# Patient Record
Sex: Female | Born: 1965 | Race: Black or African American | Hispanic: No | Smoking: Never smoker
Health system: Southern US, Community
[De-identification: ages and names within clinical notes are randomized; demographics above are authoritative.]

## PROBLEM LIST (undated history)

## (undated) DIAGNOSIS — R002 Palpitations: Secondary | ICD-10-CM

## (undated) DIAGNOSIS — E669 Obesity, unspecified: Secondary | ICD-10-CM

## (undated) DIAGNOSIS — F419 Anxiety disorder, unspecified: Secondary | ICD-10-CM

## (undated) DIAGNOSIS — K219 Gastro-esophageal reflux disease without esophagitis: Secondary | ICD-10-CM

## (undated) DIAGNOSIS — R7303 Prediabetes: Secondary | ICD-10-CM

## (undated) DIAGNOSIS — R600 Localized edema: Secondary | ICD-10-CM

## (undated) DIAGNOSIS — R0602 Shortness of breath: Secondary | ICD-10-CM

## (undated) DIAGNOSIS — D649 Anemia, unspecified: Secondary | ICD-10-CM

## (undated) DIAGNOSIS — G4730 Sleep apnea, unspecified: Secondary | ICD-10-CM

## (undated) DIAGNOSIS — K5900 Constipation, unspecified: Secondary | ICD-10-CM

## (undated) DIAGNOSIS — I10 Essential (primary) hypertension: Secondary | ICD-10-CM

## (undated) HISTORY — DX: Sleep apnea, unspecified: G47.30

## (undated) HISTORY — PX: TUBAL LIGATION: SHX77

## (undated) HISTORY — PX: WISDOM TOOTH EXTRACTION: SHX21

## (undated) HISTORY — DX: Shortness of breath: R06.02

## (undated) HISTORY — DX: Constipation, unspecified: K59.00

## (undated) HISTORY — DX: Localized edema: R60.0

## (undated) HISTORY — DX: Gastro-esophageal reflux disease without esophagitis: K21.9

## (undated) HISTORY — DX: Prediabetes: R73.03

## (undated) HISTORY — DX: Obesity, unspecified: E66.9

## (undated) HISTORY — DX: Anemia, unspecified: D64.9

## (undated) HISTORY — PX: DILATION AND CURETTAGE OF UTERUS: SHX78

## (undated) HISTORY — DX: Palpitations: R00.2

---

## 1999-05-10 ENCOUNTER — Emergency Department (HOSPITAL_COMMUNITY): Admission: EM | Admit: 1999-05-10 | Discharge: 1999-05-10 | Payer: Self-pay | Admitting: Emergency Medicine

## 1999-10-05 ENCOUNTER — Other Ambulatory Visit: Admission: RE | Admit: 1999-10-05 | Discharge: 1999-10-05 | Payer: Self-pay | Admitting: Gynecology

## 2002-03-18 ENCOUNTER — Other Ambulatory Visit: Admission: RE | Admit: 2002-03-18 | Discharge: 2002-03-18 | Payer: Self-pay | Admitting: Gynecology

## 2005-05-21 ENCOUNTER — Other Ambulatory Visit: Admission: RE | Admit: 2005-05-21 | Discharge: 2005-05-21 | Payer: Self-pay | Admitting: Gynecology

## 2005-07-23 ENCOUNTER — Emergency Department (HOSPITAL_COMMUNITY): Admission: EM | Admit: 2005-07-23 | Discharge: 2005-07-23 | Payer: Self-pay | Admitting: Emergency Medicine

## 2005-10-19 ENCOUNTER — Emergency Department (HOSPITAL_COMMUNITY): Admission: EM | Admit: 2005-10-19 | Discharge: 2005-10-20 | Payer: Self-pay | Admitting: *Deleted

## 2006-04-30 ENCOUNTER — Emergency Department (HOSPITAL_COMMUNITY): Admission: EM | Admit: 2006-04-30 | Discharge: 2006-04-30 | Payer: Self-pay | Admitting: Family Medicine

## 2006-07-08 ENCOUNTER — Emergency Department (HOSPITAL_COMMUNITY): Admission: EM | Admit: 2006-07-08 | Discharge: 2006-07-08 | Payer: Self-pay | Admitting: Emergency Medicine

## 2006-07-14 ENCOUNTER — Other Ambulatory Visit: Admission: RE | Admit: 2006-07-14 | Discharge: 2006-07-14 | Payer: Self-pay | Admitting: Gynecology

## 2013-08-23 ENCOUNTER — Encounter (HOSPITAL_COMMUNITY): Payer: Self-pay | Admitting: Pharmacist

## 2013-08-30 NOTE — H&P (Addendum)
47 yo presents for surgical mngt of endometrial polyp and irregular vb.  PMHx:  CHTN Meds:  Metoprolol All:  None PSHx:  c-section x 2 SHx:  Negative tobacco  AF, VSS Gen - NAD Abd - soft, NT CV - RRR Lungs - clear Ext - NT, no edema PV - uterus mobile nt, no adnexal masses  [place]:  Intramural and subserosal fibroids.  2cm intracavitary mass after saline infusion  A/P:  Irregular vb and endometrial polyp Hysteroscopy, D&C, resection of endometrial mass R/b/a discussed, questions answered, informed consent

## 2013-08-31 ENCOUNTER — Other Ambulatory Visit: Payer: Self-pay

## 2013-08-31 ENCOUNTER — Encounter (HOSPITAL_COMMUNITY)
Admission: RE | Admit: 2013-08-31 | Discharge: 2013-08-31 | Disposition: A | Discharge status: Home / Self Care | Payer: BC Managed Care – PPO | Source: Ambulatory Visit | Attending: Obstetrics and Gynecology | Admitting: Obstetrics and Gynecology

## 2013-08-31 ENCOUNTER — Encounter (HOSPITAL_COMMUNITY): Payer: Self-pay

## 2013-08-31 HISTORY — DX: Anxiety disorder, unspecified: F41.9

## 2013-08-31 HISTORY — DX: Essential (primary) hypertension: I10

## 2013-08-31 LAB — CBC
HCT: 37.6 % (ref 36.0–46.0)
Hemoglobin: 12.6 g/dL (ref 12.0–15.0)
MCH: 25.5 pg — ABNORMAL LOW (ref 26.0–34.0)
MCHC: 33.5 g/dL (ref 30.0–36.0)
MCV: 76.1 fL — ABNORMAL LOW (ref 78.0–100.0)
Platelets: 218 10*3/uL (ref 150–400)
RDW: 16 % — ABNORMAL HIGH (ref 11.5–15.5)
WBC: 5.9 10*3/uL (ref 4.0–10.5)

## 2013-08-31 LAB — BASIC METABOLIC PANEL
BUN: 11 mg/dL (ref 6–23)
CO2: 24 mEq/L (ref 19–32)
Calcium: 9.6 mg/dL (ref 8.4–10.5)
Chloride: 101 mEq/L (ref 96–112)
Creatinine, Ser: 0.71 mg/dL (ref 0.50–1.10)
GFR calc Af Amer: >90 mL/min (ref >90)
GFR calc non Af Amer: >90 mL/min (ref >90)
Glucose, Bld: 124 mg/dL — ABNORMAL HIGH (ref 70–99)
Potassium: 3.8 mEq/L (ref 3.7–5.3)

## 2013-08-31 MED ORDER — DEXTROSE 5 % IV SOLN
2.0000 g | INTRAVENOUS | Status: AC
  Administered 2013-09-01: 2 g via INTRAVENOUS
  Filled 2013-08-31: qty 2

## 2013-08-31 NOTE — Patient Instructions (Addendum)
   Your procedure is scheduled on: Wednesday, Dec 31  Enter through the [employer] [facility] at:  6 am Pick up the phone at the desk and dial [phone] and inform us of your arrival.  Please call this number if you have any problems the morning of surgery: [phone]  Remember: Do not eat or drink after midnight: Tuesday Take these medicines the morning of surgery with a SIP OF WATER:   Metoprolol/hctz Do not wear jewelry, make-up, or FINGER nail polish No metal in your hair or on your body. Do not wear lotions, powders, perfumes. You may wear deodorant.   Do not bring valuables to the hospital. Contacts, dentures or bridgework may not be worn into surgery.  Patients discharged on the day of surgery will not be allowed to drive home.  Home with husband Christy Middleton cell [phone].

## 2013-09-01 ENCOUNTER — Encounter (HOSPITAL_COMMUNITY)
Admission: RE | Disposition: A | Discharge status: Home / Self Care | Payer: Self-pay | Source: Ambulatory Visit | Attending: Obstetrics and Gynecology

## 2013-09-01 ENCOUNTER — Ambulatory Visit (HOSPITAL_COMMUNITY): Payer: BC Managed Care – PPO | Admitting: Anesthesiology

## 2013-09-01 ENCOUNTER — Encounter (HOSPITAL_COMMUNITY): Payer: BC Managed Care – PPO | Admitting: Anesthesiology

## 2013-09-01 ENCOUNTER — Ambulatory Visit (HOSPITAL_COMMUNITY)
Admission: RE | Admit: 2013-09-01 | Discharge: 2013-09-01 | Disposition: A | Discharge status: Home / Self Care | Payer: BC Managed Care – PPO | Source: Ambulatory Visit | Attending: Obstetrics and Gynecology | Admitting: Obstetrics and Gynecology

## 2013-09-01 ENCOUNTER — Encounter (HOSPITAL_COMMUNITY): Payer: Self-pay | Admitting: Anesthesiology

## 2013-09-01 DIAGNOSIS — N840 Polyp of corpus uteri: Secondary | ICD-10-CM | POA: Insufficient documentation

## 2013-09-01 DIAGNOSIS — D252 Subserosal leiomyoma of uterus: Secondary | ICD-10-CM | POA: Insufficient documentation

## 2013-09-01 DIAGNOSIS — D251 Intramural leiomyoma of uterus: Secondary | ICD-10-CM | POA: Insufficient documentation

## 2013-09-01 DIAGNOSIS — I10 Essential (primary) hypertension: Secondary | ICD-10-CM | POA: Insufficient documentation

## 2013-09-01 DIAGNOSIS — N938 Other specified abnormal uterine and vaginal bleeding: Secondary | ICD-10-CM | POA: Insufficient documentation

## 2013-09-01 DIAGNOSIS — N949 Unspecified condition associated with female genital organs and menstrual cycle: Secondary | ICD-10-CM | POA: Insufficient documentation

## 2013-09-01 HISTORY — PX: DILATATION & CURETTAGE/HYSTEROSCOPY WITH TRUECLEAR: SHX6353

## 2013-09-01 SURGERY — DILATATION & CURETTAGE/HYSTEROSCOPY WITH TRUCLEAR
Anesthesia: General | Site: Vagina

## 2013-09-01 MED ORDER — LIDOCAINE HCL 1 % IJ SOLN
INTRAMUSCULAR | Status: AC
  Filled 2013-09-01: qty 20

## 2013-09-01 MED ORDER — ONDANSETRON HCL 4 MG/2ML IJ SOLN
INTRAMUSCULAR | Status: DC | PRN
  Administered 2013-09-01: 4 mg via INTRAVENOUS

## 2013-09-01 MED ORDER — SODIUM CHLORIDE 0.9 % IR SOLN
Status: DC | PRN
  Administered 2013-09-01: 3000 mL

## 2013-09-01 MED ORDER — KETOROLAC TROMETHAMINE 30 MG/ML IJ SOLN
INTRAMUSCULAR | Status: AC
  Filled 2013-09-01: qty 1

## 2013-09-01 MED ORDER — FENTANYL CITRATE 0.05 MG/ML IJ SOLN
INTRAMUSCULAR | Status: DC | PRN
  Administered 2013-09-01: 50 ug via INTRAVENOUS
  Administered 2013-09-01 (×4): 25 ug via INTRAVENOUS

## 2013-09-01 MED ORDER — LIDOCAINE HCL 1 % IJ SOLN
INTRAMUSCULAR | Status: DC | PRN
  Administered 2013-09-01: 10 mL

## 2013-09-01 MED ORDER — LIDOCAINE HCL (CARDIAC) 20 MG/ML IV SOLN
INTRAVENOUS | Status: DC | PRN
  Administered 2013-09-01: 80 mg via INTRAVENOUS

## 2013-09-01 MED ORDER — MIDAZOLAM HCL 2 MG/2ML IJ SOLN
INTRAMUSCULAR | Status: DC | PRN
  Administered 2013-09-01: 1 mg via INTRAVENOUS

## 2013-09-01 MED ORDER — PROPOFOL 10 MG/ML IV EMUL
INTRAVENOUS | Status: AC
  Filled 2013-09-01: qty 40

## 2013-09-01 MED ORDER — DEXAMETHASONE SODIUM PHOSPHATE 4 MG/ML IJ SOLN
INTRAMUSCULAR | Status: DC | PRN
  Administered 2013-09-01: 5 mg via INTRAVENOUS

## 2013-09-01 MED ORDER — DEXAMETHASONE SODIUM PHOSPHATE 10 MG/ML IJ SOLN
INTRAMUSCULAR | Status: AC
  Filled 2013-09-01: qty 1

## 2013-09-01 MED ORDER — PROPOFOL 10 MG/ML IV BOLUS
INTRAVENOUS | Status: DC | PRN
  Administered 2013-09-01: 200 mg via INTRAVENOUS

## 2013-09-01 MED ORDER — LIDOCAINE HCL (CARDIAC) 20 MG/ML IV SOLN
INTRAVENOUS | Status: AC
  Filled 2013-09-01: qty 5

## 2013-09-01 MED ORDER — FENTANYL CITRATE 0.05 MG/ML IJ SOLN: 25.0000 ug | INTRAMUSCULAR | Status: DC | PRN

## 2013-09-01 MED ORDER — GLYCOPYRROLATE 0.2 MG/ML IJ SOLN
INTRAMUSCULAR | Status: DC | PRN
  Administered 2013-09-01: 0.1 mg via INTRAVENOUS

## 2013-09-01 MED ORDER — MEPERIDINE HCL 25 MG/ML IJ SOLN: 6.2500 mg | INTRAMUSCULAR | Status: DC | PRN

## 2013-09-01 MED ORDER — OXYCODONE-ACETAMINOPHEN 5-325 MG PO TABS: 2.0000 | ORAL_TABLET | ORAL | Status: DC | PRN

## 2013-09-01 MED ORDER — ONDANSETRON HCL 4 MG/2ML IJ SOLN
INTRAMUSCULAR | Status: AC
  Filled 2013-09-01: qty 2

## 2013-09-01 MED ORDER — MIDAZOLAM HCL 2 MG/2ML IJ SOLN
INTRAMUSCULAR | Status: AC
  Filled 2013-09-01: qty 2

## 2013-09-01 MED ORDER — METOCLOPRAMIDE HCL 5 MG/ML IJ SOLN
10.0000 mg | Freq: Once | INTRAMUSCULAR | Status: AC | PRN
  Administered 2013-09-01: 10 mg via INTRAVENOUS

## 2013-09-01 MED ORDER — FENTANYL CITRATE 0.05 MG/ML IJ SOLN
INTRAMUSCULAR | Status: AC
  Filled 2013-09-01: qty 5

## 2013-09-01 MED ORDER — LACTATED RINGERS IV SOLN
INTRAVENOUS | Status: DC
  Administered 2013-09-01 (×2): via INTRAVENOUS

## 2013-09-01 MED ORDER — KETOROLAC TROMETHAMINE 30 MG/ML IJ SOLN
INTRAMUSCULAR | Status: DC | PRN
  Administered 2013-09-01: 30 mg via INTRAVENOUS

## 2013-09-01 MED ORDER — METOCLOPRAMIDE HCL 5 MG/ML IJ SOLN
INTRAMUSCULAR | Status: AC
  Filled 2013-09-01: qty 2

## 2013-09-01 MED ORDER — GLYCOPYRROLATE 0.2 MG/ML IJ SOLN
INTRAMUSCULAR | Status: AC
  Filled 2013-09-01: qty 1

## 2013-09-01 SURGICAL SUPPLY — 22 items
BLADE INCISOR TRUC PLUS 2.9 (ABLATOR) IMPLANT
CANISTERS HI-FLOW 3000CC (CANNISTER) ×1 IMPLANT
CATH ROBINSON RED A/P 16FR (CATHETERS) ×2 IMPLANT
CLOTH BEACON ORANGE TIMEOUT ST (SAFETY) ×2 IMPLANT
CONTAINER PREFILL 10% NBF 60ML (FORM) ×4 IMPLANT
DRAPE HYSTEROSCOPY (DRAPE) ×2 IMPLANT
DRSG TELFA 3X8 NADH (GAUZE/BANDAGES/DRESSINGS) ×2 IMPLANT
GLOVE BIO SURGEON STRL SZ 6.5 (GLOVE) ×2 IMPLANT
GLOVE BIOGEL PI IND STRL 7.0 (GLOVE) ×1 IMPLANT
GLOVE BIOGEL PI INDICATOR 7.0 (GLOVE) ×1
GOWN STRL REIN XL XLG (GOWN DISPOSABLE) ×4 IMPLANT
INCISOR TRUC PLUS BLADE 2.9 (ABLATOR) ×2
KIT HYSTEROSCOPY TRUCLEAR (ABLATOR) ×1 IMPLANT
MORCELLATOR RECIP TRUCLEAR 4.0 (ABLATOR) IMPLANT
NDL SPNL 22GX3.5 QUINCKE BK (NEEDLE) ×1 IMPLANT
NEEDLE SPNL 22GX3.5 QUINCKE BK (NEEDLE) ×2 IMPLANT
PACK VAGINAL MINOR WOMEN LF (CUSTOM PROCEDURE TRAY) ×2 IMPLANT
PAD DRESSING TELFA 3X8 NADH (GAUZE/BANDAGES/DRESSINGS) ×1 IMPLANT
PAD OB MATERNITY 4.3X12.25 (PERSONAL CARE ITEMS) ×2 IMPLANT
SYR CONTROL 10ML LL (SYRINGE) ×2 IMPLANT
TOWEL OR 17X24 6PK STRL BLUE (TOWEL DISPOSABLE) ×4 IMPLANT
WATER STERILE IRR 1000ML POUR (IV SOLUTION) ×2 IMPLANT

## 2013-09-01 NOTE — Anesthesia Preprocedure Evaluation (Addendum)
Anesthesia Evaluation  Patient identified by MRN, date of birth, ID band Patient awake    Reviewed: Allergy & Precautions, H&P , NPO status , Patient's Chart, lab work & pertinent test results, reviewed documented beta blocker date and time   Airway Mallampati: III TM Distance: >3 FB Neck ROM: Full    Dental no notable dental hx. (+) Teeth Intact   Pulmonary neg pulmonary ROS,  breath sounds clear to auscultation  Pulmonary exam normal       Cardiovascular hypertension, Pt. on medications and Pt. on home beta blockers Rhythm:Regular Rate:Normal     Neuro/Psych Anxiety negative neurological ROS     GI/Hepatic negative GI ROS, Neg liver ROS,   Endo/Other  Morbid obesity  Renal/GU negative Renal ROS  negative genitourinary   Musculoskeletal negative musculoskeletal ROS (+)   Abdominal (+) + obese,   Peds  Hematology negative hematology ROS (+)   Anesthesia Other Findings   Reproductive/Obstetrics Endometrial Polyp Irregular menses                         Anesthesia Physical Anesthesia Plan  ASA: III  Anesthesia Plan: General   Post-op Pain Management:    Induction: Intravenous  Airway Management Planned: LMA  Additional Equipment:   Intra-op Plan:   Post-operative Plan: Extubation in OR  Informed Consent: I have reviewed the patients History and Physical, chart, labs and discussed the procedure including the risks, benefits and alternatives for the proposed anesthesia with the patient or authorized representative who has indicated his/her understanding and acceptance.   Dental advisory given  Plan Discussed with: Anesthesiologist, CRNA and Surgeon  Anesthesia Plan Comments:        Anesthesia Quick Evaluation

## 2013-09-01 NOTE — Anesthesia Postprocedure Evaluation (Signed)
Anesthesia Post Note  Patient: Christy Middleton  Procedure(s) Performed: Procedure(s) (LRB): DILATATION & CURETTAGE/HYSTEROSCOPY WITH TRUECLEAR (N/A)  Anesthesia type: GA  Patient location: PACU  Post pain: Pain level controlled  Post assessment: Post-op Vital signs reviewed  Last Vitals:  Filed Vitals:   09/01/13 0830  BP:   Pulse: 58  Temp:   Resp: 18    Post vital signs: Reviewed  Level of consciousness: sedated  Complications: No apparent anesthesia complications

## 2013-09-01 NOTE — Transfer of Care (Signed)
Immediate Anesthesia Transfer of Care Note  Patient: Christy Middleton  Procedure(s) Performed: Procedure(s): DILATATION & CURETTAGE/HYSTEROSCOPY WITH TRUECLEAR (N/A)  Patient Location: PACU  Anesthesia Type:General  Level of Consciousness: awake, alert , oriented and patient cooperative  Airway & Oxygen Therapy: Patient Spontanous Breathing and Patient connected to nasal cannula oxygen  Post-op Assessment: Report given to PACU RN and Post -op Vital signs reviewed and stable  Post vital signs: stable  Complications: No apparent anesthesia complications

## 2013-09-02 ENCOUNTER — Encounter (HOSPITAL_COMMUNITY): Payer: Self-pay | Admitting: Obstetrics and Gynecology

## 2013-09-02 NOTE — Op Note (Signed)
[name], Christy Middleton                ACCOUNT NO.:  [id number]  MEDICAL RECORD NO.:  [id number]  LOCATION:  [facility]                          FACILITY:  [facility]  PHYSICIAN:  Marylynn Pearson, MD    DATE OF BIRTH:  [birth year]  DATE OF PROCEDURE:  09/01/2013 DATE OF DISCHARGE:  09/01/2013                              OPERATIVE REPORT   PREOPERATIVE DIAGNOSIS:  Endometrial mass.  POSTOPERATIVE DIAGNOSIS:  Endometrial mass, path pending.  PROCEDURE: 1. Cervical block. 2. Hysteroscopy. 3. D and C. 4. Resection of endometrial polyp using Truclear.  ANESTHESIA:  General.  SPECIMEN:  Endometrial curettings and polypoid mass.  COMPLICATIONS:  None.  CONDITION:  Stable to recovery room.  DESCRIPTION OF PROCEDURE:  The patient was taken to the operating room. After informed consent was obtained, she was given general anesthesia, placed in the dorsal lithotomy position using Allen stirrups.  She was prepped and draped in sterile fashion.  An in- and out catheter was used to drain her bladder.  Bivalve speculum was placed in the vagina and a single-tooth tenaculum attached to the anterior lip of the cervix.  The cervix was serially dilated with Kennon Rounds dilators and a small scope was inserted into the endometrial cavity.  Endometrial cavity appeared normal, without masses.  Polypoid mass was noted at the internal os. The Truclear device was inserted through the scope and used to resect the polyp to the base.  Hysteroscope was then removed and a gentle curetting was performed throughout the endometrial cavity and placed on Telfa, passed off to be sent to pathology.  Hysteroscope was reinserted one last time.  No other abnormalities or masses were seen. Hysteroscope was removed.  Cervical block was performed.  Tenaculum was removed.  A 1 mL of Xylocaine was placed at the anterior lip of the cervix.  Speculum was removed.  She was taken to the recovery room in stable condition.  Sponge, lap, needle,  and instrument counts were correct x2.     Marylynn Pearson, MD     GA/MEDQ  D:  09/01/2013  T:  09/02/2013  Job:  [id number]

## 2014-11-30 ENCOUNTER — Other Ambulatory Visit: Payer: Self-pay | Admitting: Gynecology

## 2014-12-02 LAB — CYTOLOGY - PAP

## 2015-08-25 ENCOUNTER — Encounter: Payer: Self-pay | Admitting: Family Medicine

## 2015-08-31 ENCOUNTER — Institutional Professional Consult (permissible substitution): Payer: Self-pay | Admitting: Neurology

## 2016-04-04 ENCOUNTER — Ambulatory Visit: Payer: BC Managed Care – PPO | Admitting: Gynecology

## 2016-04-16 ENCOUNTER — Encounter: Payer: Self-pay | Admitting: Gynecology

## 2016-04-16 ENCOUNTER — Ambulatory Visit (INDEPENDENT_AMBULATORY_CARE_PROVIDER_SITE_OTHER): Payer: BC Managed Care – PPO | Admitting: Gynecology

## 2016-04-16 VITALS — BP 160/98 | Ht 64.0 in | Wt 224.0 lb

## 2016-04-16 DIAGNOSIS — Z01419 Encounter for gynecological examination (general) (routine) without abnormal findings: Secondary | ICD-10-CM

## 2016-04-16 NOTE — Progress Notes (Signed)
Christy Middleton Apr 16, 1966 [id number]   History:    50 y.o.  for annual gyn exam who is a new patient to the practice. Patient prior patient of Dr. Lowella Dell who has retired. Patient stated she had a full gynecological examination year ago with Pap smear. Patient denies any prior history of any abnormal Pap smear. She is currently menstruating once a month still. She has no vasomotor symptoms as had a tubal ligation the past. Several years ago he cousin dysfunctional uterine bleeding she had a resectoscopic polypectomy. Patient has not had a colonoscopy as of yet. Her PCP is Dr. Nena Polio who is treating and monitoring her hypertension  Past medical history,surgical history, family history and social history were all reviewed and documented in the EPIC chart.  Gynecologic History Patient's last menstrual period was 03/29/2016. Contraception: tubal ligation Last Pap: 2016. Results were: normal Last mammogram: 2016. Results were: normal  Obstetric History OB History  Gravida Para Term Preterm AB Living  2       0 2  SAB TAB Ectopic Multiple Live Births      0        # Outcome Date GA Lbr Len/2nd Weight Sex Delivery Anes PTL Lv  2 Gravida           1 Gravida                ROS: A ROS was performed and pertinent positives and negatives are included in the history.  GENERAL: No fevers or chills. HEENT: No change in vision, no earache, sore throat or sinus congestion. NECK: No pain or stiffness. CARDIOVASCULAR: No chest pain or pressure. No palpitations. PULMONARY: No shortness of breath, cough or wheeze. GASTROINTESTINAL: No abdominal pain, nausea, vomiting or diarrhea, melena or bright red blood per rectum. GENITOURINARY: No urinary frequency, urgency, hesitancy or dysuria. MUSCULOSKELETAL: No joint or muscle pain, no back pain, no recent trauma. DERMATOLOGIC: No rash, no itching, no lesions. ENDOCRINE: No polyuria, polydipsia, no heat or cold intolerance. No recent change in weight.  HEMATOLOGICAL: No anemia or easy bruising or bleeding. NEUROLOGIC: No headache, seizures, numbness, tingling or weakness. PSYCHIATRIC: No depression, no loss of interest in normal activity or change in sleep pattern.     Exam: chaperone present  BP (!) 160/98   Ht 5\' 4"  (1.626 m)   Wt 224 lb (101.6 kg)   LMP 03/29/2016   BMI 38.45 kg/m   Body mass index is 38.45 kg/m.  General appearance : Well developed well nourished female. No acute distress HEENT: Eyes: no retinal hemorrhage or exudates,  Neck supple, trachea midline, no carotid bruits, no thyroidmegaly Lungs: Clear to auscultation, no rhonchi or wheezes, or rib retractions  Heart: Regular rate and rhythm, no murmurs or gallops Breast:Examined in sitting and supine position were symmetrical in appearance, no palpable masses or tenderness,  no skin retraction, no nipple inversion, no nipple discharge, no skin discoloration, no axillary or supraclavicular lymphadenopathy Abdomen: no palpable masses or tenderness, no rebound or guarding Extremities: no edema or skin discoloration or tenderness  Pelvic:  Bartholin, Urethra, Skene Glands: Within normal limits             Vagina: No gross lesions or discharge  Cervix: No gross lesions or discharge  Uterus  anteverted, normal size, shape and consistency, non-tender and mobile  Adnexa  Without masses or tenderness  Anus and perineum  normal   Rectovaginal  normal sphincter tone without palpated masses or tenderness  Hemoccult not indicated   Blood pressure: 160/98 patient was asked to rest for 15 minutes and her blood pressure was retaken 130/80 mmHg.   Assessment/Plan:  50 y.o. female for annual exam with history of hypertension is scheduled for her annual exam next month with her PCP AB encourage her to contact her office this week to be seen to adjust her blood pressure medication. She was otherwise asymptomatic today. Pap smear not done since she had a normal one last  year she's had history of normal Pap smears according to the new guidelines. Her PCP has been doing her blood work. I have recommended that she have colonoscopy this year and I provided her information on colonoscopy the names of community gastroenterologist for her to schedule. She was encouraged to do her monthly breast exam. We discussed importance of calcium vitamin D and weightbearing exercises for osteoporosis prevention.   Terrance Mass MD, 10:03 AM 04/16/2016

## 2016-04-16 NOTE — Patient Instructions (Signed)
Perimenopause Perimenopause is the time when your body begins to move into the menopause (no menstrual period for 12 straight months). It is a natural process. Perimenopause can begin 2-8 years before the menopause and usually lasts for 1 year after the menopause. During this time, your ovaries may or may not produce an egg. The ovaries vary in their production of estrogen and progesterone hormones each month. This can cause irregular menstrual periods, difficulty getting pregnant, vaginal bleeding between periods, and uncomfortable symptoms. CAUSES  Irregular production of the ovarian hormones, estrogen and progesterone, and not ovulating every month.  Other causes include:  Tumor of the pituitary gland in the brain.  Medical disease that affects the ovaries.  Radiation treatment.  Chemotherapy.  Unknown causes.  Heavy smoking and excessive alcohol intake can bring on perimenopause sooner. SIGNS AND SYMPTOMS   Hot flashes.  Night sweats.  Irregular menstrual periods.  Decreased sex drive.  Vaginal dryness.  Headaches.  Mood swings.  Depression.  Memory problems.  Irritability.  Tiredness.  Weight gain.  Trouble getting pregnant.  The beginning of losing bone cells (osteoporosis).  The beginning of hardening of the arteries (atherosclerosis). DIAGNOSIS  Your health care provider will make a diagnosis by analyzing your age, menstrual history, and symptoms. He or she will do a physical exam and note any changes in your body, especially your female organs. Female hormone tests may or may not be helpful depending on the amount of female hormones you produce and when you produce them. However, other hormone tests may be helpful to rule out other problems. TREATMENT  In some cases, no treatment is needed. The decision on whether treatment is necessary during the perimenopause should be made by you and your health care provider based on how the symptoms are affecting you  and your lifestyle. Various treatments are available, such as:  Treating individual symptoms with a specific medicine for that symptom.  Herbal medicines that can help specific symptoms.  Counseling.  Group therapy. HOME CARE INSTRUCTIONS   Keep track of your menstrual periods (when they occur, how heavy they are, how long between periods, and how long they last) as well as your symptoms and when they started.  Only take over-the-counter or prescription medicines as directed by your health care provider.  Sleep and rest.  Exercise.  Eat a diet that contains calcium (good for your bones) and soy (acts like the estrogen hormone).  Do not smoke.  Avoid alcoholic beverages.  Take vitamin supplements as recommended by your health care provider. Taking vitamin E may help in certain cases.  Take calcium and vitamin D supplements to help prevent bone loss.  Group therapy is sometimes helpful.  Acupuncture may help in some cases. SEEK MEDICAL CARE IF:   You have questions about any symptoms you are having.  You need a referral to a specialist (gynecologist, psychiatrist, or psychologist). SEEK IMMEDIATE MEDICAL CARE IF:   You have vaginal bleeding.  Your period lasts longer than 8 days.  Your periods are recurring sooner than 21 days.  You have bleeding after intercourse.  You have severe depression.  You have pain when you urinate.  You have severe headaches.  You have vision problems.   This information is not intended to replace advice given to you by your health care provider. Make sure you discuss any questions you have with your health care provider.   Document Released: 09/26/2004 Document Revised: 09/09/2014 Document Reviewed: 03/18/2013 Elsevier Interactive Patient Education [employer].  Colonoscopy A colonoscopy is an exam to look at the entire large intestine (colon). This exam can help find problems such as tumors, polyps, inflammation, and areas  of bleeding. The exam takes about 1 hour.  LET [facility] CARE PROVIDER KNOW ABOUT:   Any allergies you have.  All medicines you are taking, including vitamins, herbs, eye drops, creams, and over-the-counter medicines.  Previous problems you or members of your family have had with the use of anesthetics.  Any blood disorders you have.  Previous surgeries you have had.  Medical conditions you have. RISKS AND COMPLICATIONS  Generally, this is a safe procedure. However, as with any procedure, complications can occur. Possible complications include:  Bleeding.  Tearing or rupture of the colon wall.  Reaction to medicines given during the exam.  Infection (rare). BEFORE THE PROCEDURE   Ask your health care provider about changing or stopping your regular medicines.  You may be prescribed an oral bowel prep. This involves drinking a large amount of medicated liquid, starting the day before your procedure. The liquid will cause you to have multiple loose stools until your stool is almost clear or light green. This cleans out your colon in preparation for the procedure.  Do not eat or drink anything else once you have started the bowel prep, unless your health care provider tells you it is safe to do so.  Arrange for someone to drive you home after the procedure. PROCEDURE   You will be given medicine to help you relax (sedative).  You will lie on your side with your knees bent.  A long, flexible tube with a light and camera on the end (colonoscope) will be inserted through the rectum and into the colon. The camera sends video back to a computer screen as it moves through the colon. The colonoscope also releases carbon dioxide gas to inflate the colon. This helps your health care provider see the area better.  During the exam, your health care provider may take a small tissue sample (biopsy) to be examined under a microscope if any abnormalities are found.  The exam is finished  when the entire colon has been viewed. AFTER THE PROCEDURE   Do not drive for 24 hours after the exam.  You may have a small amount of blood in your stool.  You may pass moderate amounts of gas and have mild abdominal cramping or bloating. This is caused by the gas used to inflate your colon during the exam.  Ask when your test results will be ready and how you will get your results. Make sure you get your test results.   This information is not intended to replace advice given to you by your health care provider. Make sure you discuss any questions you have with your health care provider.   Document Released: 08/16/2000 Document Revised: 06/09/2013 Document Reviewed: 04/26/2013 Elsevier Interactive Patient Education [employer].

## 2017-01-15 ENCOUNTER — Encounter: Payer: Self-pay | Admitting: Gynecology

## 2017-04-17 ENCOUNTER — Encounter: Payer: BC Managed Care – PPO | Admitting: Gynecology

## 2017-08-17 ENCOUNTER — Encounter (HOSPITAL_BASED_OUTPATIENT_CLINIC_OR_DEPARTMENT_OTHER): Payer: Self-pay | Admitting: *Deleted

## 2017-08-17 ENCOUNTER — Emergency Department (HOSPITAL_BASED_OUTPATIENT_CLINIC_OR_DEPARTMENT_OTHER): Payer: Self-pay

## 2017-08-17 ENCOUNTER — Other Ambulatory Visit: Payer: Self-pay

## 2017-08-17 ENCOUNTER — Emergency Department (HOSPITAL_BASED_OUTPATIENT_CLINIC_OR_DEPARTMENT_OTHER)
Admission: EM | Admit: 2017-08-17 | Discharge: 2017-08-17 | Disposition: A | Discharge status: Home / Self Care | Payer: Self-pay | Attending: Physician Assistant | Admitting: Physician Assistant

## 2017-08-17 DIAGNOSIS — I10 Essential (primary) hypertension: Secondary | ICD-10-CM | POA: Insufficient documentation

## 2017-08-17 DIAGNOSIS — Z79899 Other long term (current) drug therapy: Secondary | ICD-10-CM | POA: Insufficient documentation

## 2017-08-17 DIAGNOSIS — R0789 Other chest pain: Secondary | ICD-10-CM | POA: Insufficient documentation

## 2017-08-17 DIAGNOSIS — F419 Anxiety disorder, unspecified: Secondary | ICD-10-CM | POA: Insufficient documentation

## 2017-08-17 LAB — CBC WITH DIFFERENTIAL/PLATELET
BASOS ABS: 0 10*3/uL (ref 0.0–0.1)
Basophils Relative: 0 %
Eosinophils Absolute: 0.1 10*3/uL (ref 0.0–0.7)
Eosinophils Relative: 3 %
HEMATOCRIT: 36.6 % (ref 36.0–46.0)
HEMOGLOBIN: 12.2 g/dL (ref 12.0–15.0)
LYMPHS PCT: 36 %
Lymphs Abs: 1.7 10*3/uL (ref 0.7–4.0)
MCH: 25.7 pg — ABNORMAL LOW (ref 26.0–34.0)
MCHC: 33.3 g/dL (ref 30.0–36.0)
MCV: 77.2 fL — AB (ref 78.0–100.0)
Monocytes Absolute: 0.4 10*3/uL (ref 0.1–1.0)
Monocytes Relative: 8 %
NEUTROS ABS: 2.6 10*3/uL (ref 1.7–7.7)
Neutrophils Relative %: 53 %
Platelets: 234 10*3/uL (ref 150–400)
RBC: 4.74 MIL/uL (ref 3.87–5.11)
RDW: 16 % — ABNORMAL HIGH (ref 11.5–15.5)
WBC: 4.9 10*3/uL (ref 4.0–10.5)

## 2017-08-17 LAB — BASIC METABOLIC PANEL
ANION GAP: 8 (ref 5–15)
BUN: 11 mg/dL (ref 6–20)
CHLORIDE: 106 mmol/L (ref 101–111)
CO2: 25 mmol/L (ref 22–32)
Calcium: 9.3 mg/dL (ref 8.9–10.3)
Creatinine, Ser: 0.85 mg/dL (ref 0.44–1.00)
GFR calc Af Amer: >60 mL/min (ref >60)
GFR calc non Af Amer: >60 mL/min (ref >60)
GLUCOSE: 130 mg/dL — AB (ref 65–99)
POTASSIUM: 3.4 mmol/L — AB (ref 3.5–5.1)
Sodium: 139 mmol/L (ref 135–145)

## 2017-08-17 LAB — TROPONIN I: Troponin I: <0.03 ng/mL (ref <0.03)

## 2017-08-17 MED ORDER — GI COCKTAIL ~~LOC~~
30.0000 mL | Freq: Once | ORAL | Status: AC
  Administered 2017-08-17: 30 mL via ORAL
  Filled 2017-08-17: qty 30

## 2017-08-17 MED ORDER — AMLODIPINE BESYLATE 5 MG PO TABS
5.0000 mg | ORAL_TABLET | Freq: Once | ORAL | Status: AC
  Administered 2017-08-17: 5 mg via ORAL
  Filled 2017-08-17: qty 1

## 2017-08-17 MED ORDER — OMEPRAZOLE 20 MG PO CPDR: 20.0000 mg | DELAYED_RELEASE_CAPSULE | Freq: Every day | ORAL | 0 refills | Status: DC

## 2017-08-17 NOTE — Discharge Instructions (Signed)
Christy Middleton, you were seen today for chest pain.  Fortunately you had a normal EKG and troponins and we did not think this was your heart.  Most likely this was due to acid reflux.  I have sent you home with a prescription for medication called omeprazole.  Please take this medication daily for the next month.  I would recommend that you follow-up with your primary doctor in 2 weeks to see if this has resolved your symptoms.
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
## 2017-08-17 NOTE — ED Triage Notes (Signed)
Pt reports chest pain 1 hour pta. Pain started after eating. States pain in abdomen and chest. She was sweaty when pain started. States sx are improving

## 2017-08-17 NOTE — ED Notes (Signed)
Patient transported to X-ray 

## 2017-08-17 NOTE — ED Notes (Signed)
ED Provider at bedside. 

## 2019-03-11 ENCOUNTER — Other Ambulatory Visit (HOSPITAL_COMMUNITY)
Admission: RE | Admit: 2019-03-11 | Discharge: 2019-03-11 | Disposition: A | Discharge status: Home / Self Care | Payer: BC Managed Care – PPO | Source: Ambulatory Visit | Attending: Obstetrics and Gynecology | Admitting: Obstetrics and Gynecology

## 2019-03-11 ENCOUNTER — Other Ambulatory Visit: Payer: Self-pay | Admitting: Obstetrics and Gynecology

## 2019-03-11 DIAGNOSIS — Z01419 Encounter for gynecological examination (general) (routine) without abnormal findings: Secondary | ICD-10-CM | POA: Insufficient documentation

## 2019-03-16 LAB — CYTOLOGY - PAP
Adequacy: ABSENT
Diagnosis: NEGATIVE
HPV: NOT DETECTED

## 2019-09-10 ENCOUNTER — Ambulatory Visit (HOSPITAL_COMMUNITY)
Admission: EM | Admit: 2019-09-10 | Discharge: 2019-09-10 | Disposition: A | Discharge status: Home / Self Care | Payer: BC Managed Care – PPO | Attending: Family Medicine | Admitting: Family Medicine

## 2019-09-10 ENCOUNTER — Encounter (HOSPITAL_COMMUNITY): Payer: Self-pay | Admitting: Family Medicine

## 2019-09-10 ENCOUNTER — Other Ambulatory Visit: Payer: Self-pay

## 2019-09-10 DIAGNOSIS — M545 Low back pain, unspecified: Secondary | ICD-10-CM

## 2019-09-10 MED ORDER — HYDROCODONE-ACETAMINOPHEN 5-325 MG PO TABS: 1.0000 | ORAL_TABLET | Freq: Four times a day (QID) | ORAL | 0 refills | Status: DC | PRN
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
## 2019-10-09 IMAGING — CR DG CHEST 2V
2 series · 2 of 2 positions shown · non-contrast
Comparison: 07/08/2006

CLINICAL DATA: Acute onset chest discomfort and lightheadedness x4
hours.

EXAM:
CHEST  2 VIEW

[w chest pa]
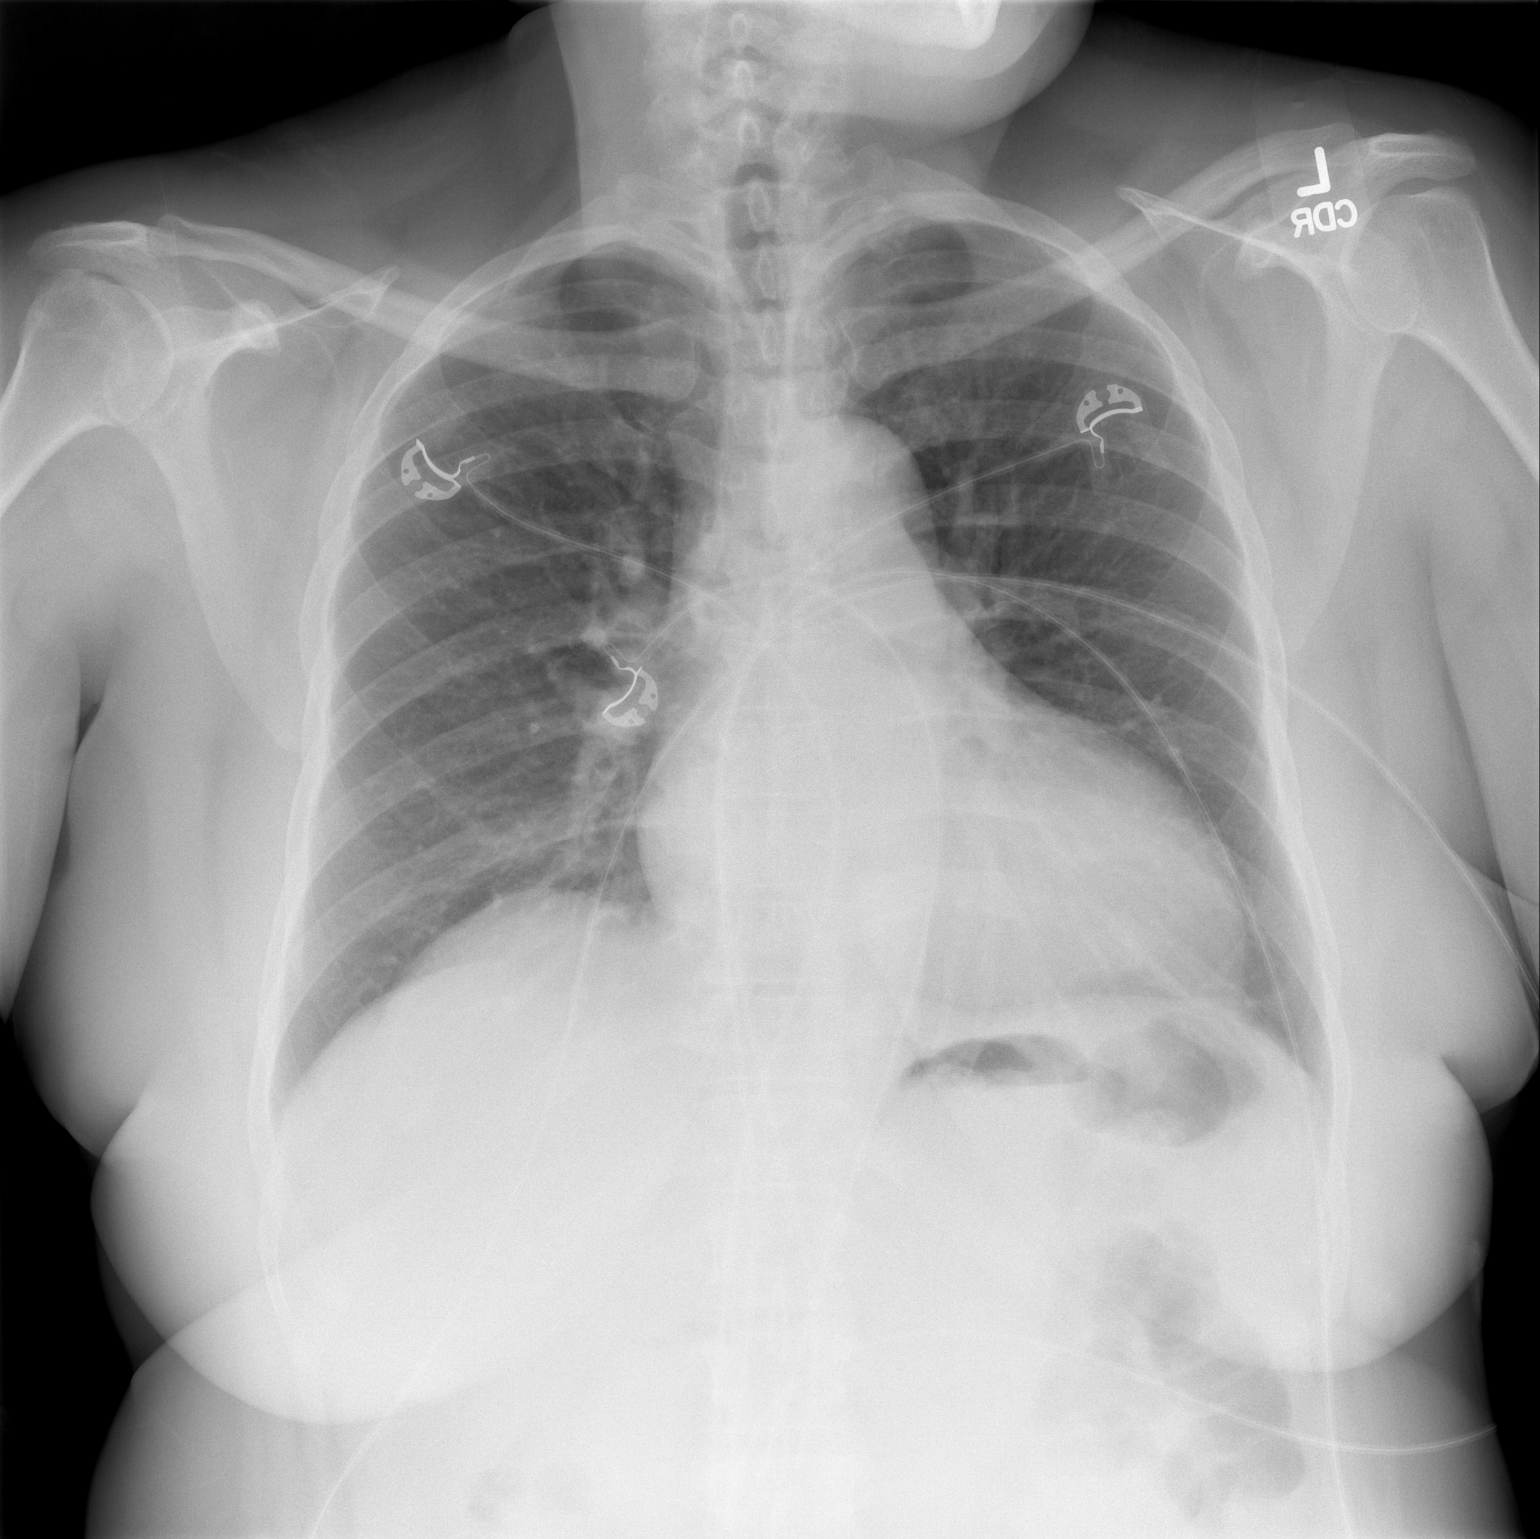

[w chest lat]
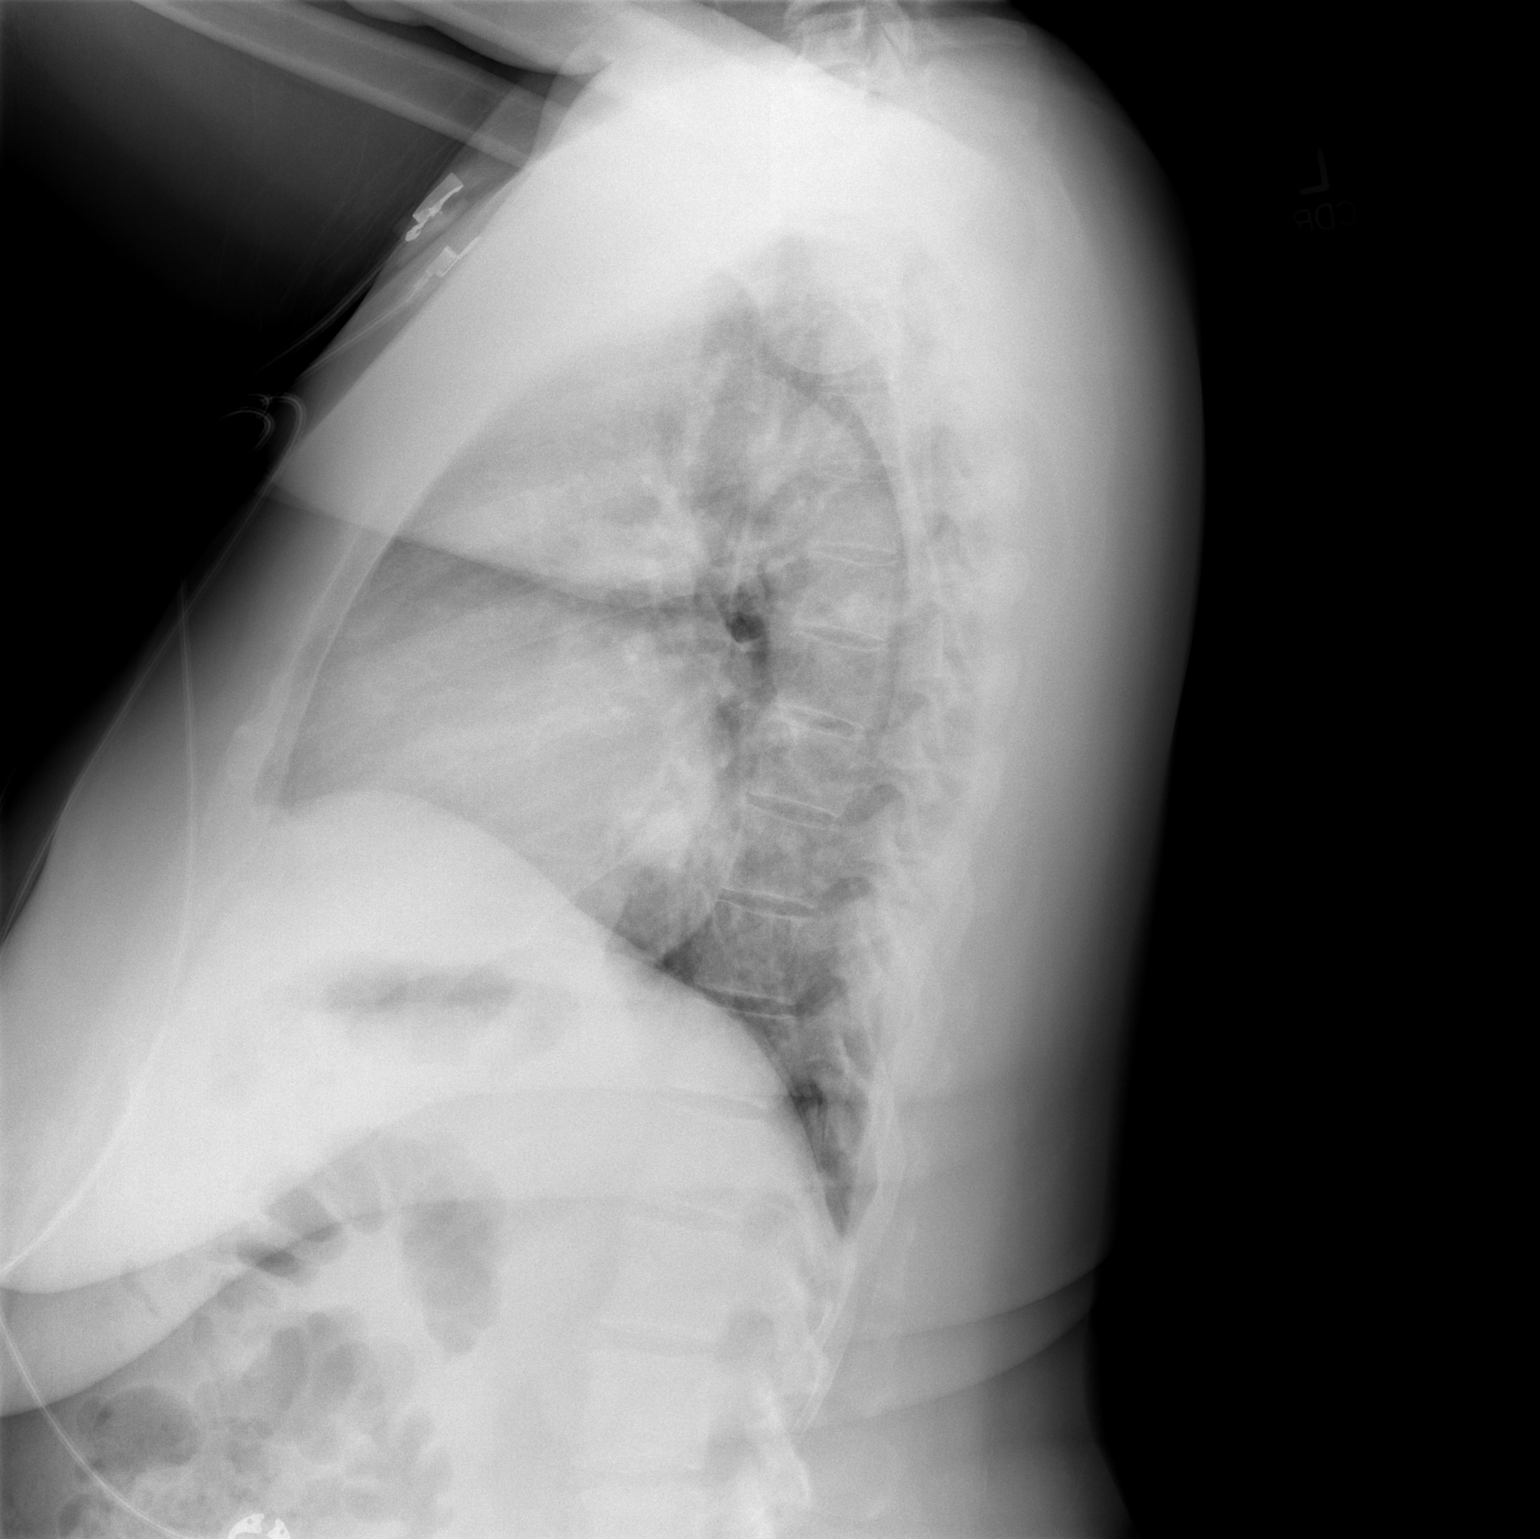

[2 of 2 positions shown; findings below may reference images not displayed]

FINDINGS: Stable mild cardiomegaly. No aortic aneurysm. Minimal atelectasis at
the right lung base. No acute pneumonic consolidation, CHF, effusion
or pneumothorax. No suspicious osseous lesions.
IMPRESSION: Stable mild cardiomegaly.  No active pulmonary disease.

## 2019-10-30 ENCOUNTER — Ambulatory Visit: Payer: BC Managed Care – PPO | Attending: Internal Medicine

## 2019-10-30 DIAGNOSIS — Z23 Encounter for immunization: Secondary | ICD-10-CM | POA: Insufficient documentation

## 2019-10-30 NOTE — Progress Notes (Signed)
   Covid-19 Vaccination Clinic  Name:  Noralyn Anderberg    MRN: [id number] DOB: [birth year]  10/30/2019  Ms. Sterman was observed post Covid-19 immunization for 15 minutes without incidence. She was provided with Vaccine Information Sheet and instruction to access the V-Safe system.   Ms. Mewes was instructed to call 911 with any severe reactions post vaccine: Marland Kitchen Difficulty breathing  . Swelling of your face and throat  . A fast heartbeat  . A bad rash all over your body  . Dizziness and weakness    Immunizations Administered    Name Date Dose VIS Date Route   Pfizer COVID-19 Vaccine 10/30/2019 12:09 PM 0.3 mL 08/13/2019 Intramuscular   Manufacturer: [place]   Lot: [id number]   [place]: [id number]

## 2019-11-20 ENCOUNTER — Ambulatory Visit: Payer: BC Managed Care – PPO | Attending: Internal Medicine

## 2019-11-20 DIAGNOSIS — Z23 Encounter for immunization: Secondary | ICD-10-CM

## 2019-11-20 NOTE — Progress Notes (Signed)
   Covid-19 Vaccination Clinic  Name:  Christy Middleton    MRN: [id number] DOB: 1966-03-09  11/20/2019  [name] was observed post Covid-19 immunization for 15 minutes without incident. She was provided with Vaccine Information Sheet and instruction to access the V-Safe system.   [name] was instructed to call 911 with any severe reactions post vaccine: Marland Kitchen Difficulty breathing  . Swelling of face and throat  . A fast heartbeat  . A bad rash all over body  . Dizziness and weakness   Immunizations Administered    Name Date Dose VIS Date Route   Pfizer COVID-19 Vaccine 11/20/2019 11:20 AM 0.3 mL 08/13/2019 Intramuscular   Manufacturer: [place]   Lot: [id number]   [place]: [id number]

## 2019-11-24 ENCOUNTER — Ambulatory Visit: Payer: BC Managed Care – PPO

## 2020-11-06 ENCOUNTER — Ambulatory Visit: Payer: BC Managed Care – PPO | Admitting: Podiatry

## 2021-07-27 ENCOUNTER — Telehealth: Payer: BC Managed Care – PPO | Admitting: Emergency Medicine

## 2021-07-27 DIAGNOSIS — K047 Periapical abscess without sinus: Secondary | ICD-10-CM

## 2021-07-27 MED ORDER — AMOXICILLIN 500 MG PO CAPS: 500.0000 mg | ORAL_CAPSULE | Freq: Three times a day (TID) | ORAL | 0 refills | Status: AC

## 2021-07-27 MED ORDER — NAPROXEN 500 MG PO TABS: 500.0000 mg | ORAL_TABLET | Freq: Two times a day (BID) | ORAL | 0 refills | Status: DC

## 2021-07-27 NOTE — Progress Notes (Signed)

## 2021-07-27 NOTE — Progress Notes (Signed)
Error. Duplicate evisit sent in by pt.

## 2022-04-11 ENCOUNTER — Other Ambulatory Visit: Payer: Self-pay | Admitting: Obstetrics and Gynecology

## 2022-04-11 DIAGNOSIS — Z1231 Encounter for screening mammogram for malignant neoplasm of breast: Secondary | ICD-10-CM

## 2022-05-07 ENCOUNTER — Ambulatory Visit: Payer: BC Managed Care – PPO

## 2022-06-25 ENCOUNTER — Other Ambulatory Visit: Payer: Self-pay

## 2022-11-27 ENCOUNTER — Ambulatory Visit (HOSPITAL_BASED_OUTPATIENT_CLINIC_OR_DEPARTMENT_OTHER): Admit: 2022-11-27 | Payer: BC Managed Care – PPO | Admitting: Obstetrics and Gynecology

## 2022-11-27 ENCOUNTER — Encounter (HOSPITAL_BASED_OUTPATIENT_CLINIC_OR_DEPARTMENT_OTHER): Payer: Self-pay

## 2022-11-27 SURGERY — DILATATION & CURETTAGE/HYSTEROSCOPY WITH MYOSURE
Anesthesia: Choice

## 2022-12-30 ENCOUNTER — Encounter (INDEPENDENT_AMBULATORY_CARE_PROVIDER_SITE_OTHER): Payer: Self-pay | Admitting: Family Medicine

## 2022-12-30 ENCOUNTER — Ambulatory Visit (INDEPENDENT_AMBULATORY_CARE_PROVIDER_SITE_OTHER): Payer: BC Managed Care – PPO | Admitting: Family Medicine

## 2022-12-30 VITALS — BP 151/86 | HR 78 | Temp 98.0°F | Ht 64.0 in | Wt 243.4 lb

## 2022-12-30 DIAGNOSIS — Z0289 Encounter for other administrative examinations: Secondary | ICD-10-CM

## 2022-12-30 DIAGNOSIS — I10 Essential (primary) hypertension: Secondary | ICD-10-CM | POA: Insufficient documentation

## 2022-12-30 DIAGNOSIS — R7303 Prediabetes: Secondary | ICD-10-CM

## 2022-12-30 DIAGNOSIS — Z6841 Body Mass Index (BMI) 40.0 and over, adult: Secondary | ICD-10-CM | POA: Diagnosis not present

## 2023-01-01 NOTE — Progress Notes (Signed)
Office: [phone]  /  Fax: [phone]   Initial Visit  Christy Middleton was seen in clinic today to evaluate for obesity. She is interested in losing weight to improve overall health and reduce the risk of weight related complications. She presents today to review program treatment options, initial physical assessment, and evaluation.     She was referred by: PCP  When asked how has your weight affected you? She states: Contributed to medical problems  Some associated conditions: Hypertension and Prediabetes  Response to medication: Never tried medications   Past medical history includes:   Past Medical History:  Diagnosis Date   Anxiety    no meds currently   Hypertension      Objective:   BP (!) 151/86   Pulse 78   Temp 98 F (36.7 C)   Ht 5\' 4"  (1.626 m)   Wt 243 lb 6.4 oz (110.4 kg)   SpO2 95%   BMI 41.78 kg/m  She was weighed on the bioimpedance scale: Body mass index is 41.78 kg/m. Visceral Fat Rating:14, Body Fat%:44.5.  General:  Alert, oriented and cooperative. Patient is in no acute distress.  Respiratory: Normal respiratory effort, no problems with respiration noted  Extremities: Normal range of motion.    Mental Status: Normal mood and affect. Normal behavior. Normal judgment and thought content.   Assessment and Plan:  1. Prediabetes Christy Middleton has had multiple elevated A1c at 5.9.  She is not on metformin, and she is ready to work on her diet and exercise.  Christy Middleton is to schedule a fasting visit to start her evaluation and eating plan.  2. Primary hypertension Christy Middleton's blood pressure is elevated today even after repeat blood pressure check.  She is on blood pressure medications and she had lower extremity edema on Norvasc in the past.  Christy Middleton will continue olmesartan-hydrochlorothiazide, and will start her eating plan after her workup.  3. Class 3 severe obesity with serious comorbidity and body mass index (BMI) of 40.0 to 44.9 in adult, unspecified  obesity type (HCC) We reviewed weight, biometrics, associated medical conditions and contributing factors with patient. She would benefit from weight loss therapy via a modified calorie, low-carb, high-protein nutritional plan tailored to their REE (resting energy expenditure) which will be determined by indirect calorimetry.  We will also assess for cardiometabolic risk and nutritional derangements via fasting serologies at her next appointment.      Obesity Treatment / Action Plan:  Will complete provided nutritional and psychosocial assessment questionnaire before the next appointment. Will think about ideas on how to incorporate physical activity into their daily routine. Was counseled on nutritional approaches to weight loss and benefits of complex carbs and high quality protein as part of nutritional weight management.  Obesity Education Performed Today:  She was weighed on the bioimpedance scale and results were discussed and documented in the synopsis.  We discussed obesity as a disease and the importance of a more detailed evaluation of all the factors contributing to the disease.  We discussed the importance of long term lifestyle changes which include nutrition, exercise and behavioral modifications as well as the importance of customizing this to her specific health and social needs.  We discussed the benefits of reaching a healthier weight to alleviate the symptoms of existing conditions and reduce the risks of the biomechanical, metabolic and psychological effects of obesity.  Christy Middleton appears to be in the action stage of change and states they are ready to start intensive lifestyle modifications and behavioral  modifications.  40 minutes was spent today on this visit including the above counseling, pre-visit chart review, and post-visit documentation.  Reviewed by clinician on day of visit: allergies, medications, problem list, medical history, surgical history, family  history, social history, and previous encounter notes.  I have personally spent 42 minutes total time today in preparation, patient care, and documentation for this visit, including the following: review of clinical lab tests; review of medical tests/procedures/services.   I, Burt Knack, am acting as transcriptionist for Quillian Quince, MD   I have reviewed the above documentation for accuracy and completeness, and I agree with the above. Quillian Quince, MD

## 2023-01-13 ENCOUNTER — Encounter (INDEPENDENT_AMBULATORY_CARE_PROVIDER_SITE_OTHER): Payer: Self-pay | Admitting: Family Medicine

## 2023-01-13 ENCOUNTER — Ambulatory Visit (INDEPENDENT_AMBULATORY_CARE_PROVIDER_SITE_OTHER): Payer: BC Managed Care – PPO | Admitting: Family Medicine

## 2023-01-13 VITALS — BP 138/83 | HR 73 | Temp 98.7°F | Ht 64.0 in | Wt 247.0 lb

## 2023-01-13 DIAGNOSIS — E559 Vitamin D deficiency, unspecified: Secondary | ICD-10-CM

## 2023-01-13 DIAGNOSIS — R7303 Prediabetes: Secondary | ICD-10-CM | POA: Diagnosis not present

## 2023-01-13 DIAGNOSIS — R0602 Shortness of breath: Secondary | ICD-10-CM

## 2023-01-13 DIAGNOSIS — Z1331 Encounter for screening for depression: Secondary | ICD-10-CM

## 2023-01-13 DIAGNOSIS — E66813 Obesity, class 3: Secondary | ICD-10-CM

## 2023-01-13 DIAGNOSIS — R5383 Other fatigue: Secondary | ICD-10-CM | POA: Diagnosis not present

## 2023-01-13 DIAGNOSIS — Z6841 Body Mass Index (BMI) 40.0 and over, adult: Secondary | ICD-10-CM

## 2023-01-14 LAB — CMP14+EGFR
ALT: 19 IU/L (ref 0–32)
AST: 20 IU/L (ref 0–40)
Albumin/Globulin Ratio: 1.5 (ref 1.2–2.2)
Albumin: 4.4 g/dL (ref 3.8–4.9)
Alkaline Phosphatase: 91 IU/L (ref 44–121)
BUN/Creatinine Ratio: 17 (ref 9–23)
BUN: 15 mg/dL (ref 6–24)
Bilirubin Total: <0.2 mg/dL (ref 0.0–1.2)
CO2: 21 mmol/L (ref 20–29)
Calcium: 9.5 mg/dL (ref 8.7–10.2)
Chloride: 102 mmol/L (ref 96–106)
Creatinine, Ser: 0.86 mg/dL (ref 0.57–1.00)
Globulin, Total: 3 g/dL (ref 1.5–4.5)
Glucose: 106 mg/dL — ABNORMAL HIGH (ref 70–99)
Potassium: 4 mmol/L (ref 3.5–5.2)
Sodium: 141 mmol/L (ref 134–144)
Total Protein: 7.4 g/dL (ref 6.0–8.5)
eGFR: 79 mL/min/{1.73_m2} (ref >59)

## 2023-01-14 LAB — INSULIN, RANDOM: INSULIN: 11.3 u[IU]/mL (ref 2.6–24.9)

## 2023-01-14 LAB — VITAMIN B12: Vitamin B-12: 469 pg/mL (ref 232–1245)

## 2023-01-14 LAB — VITAMIN D 25 HYDROXY (VIT D DEFICIENCY, FRACTURES): Vit D, 25-Hydroxy: 21.6 ng/mL — ABNORMAL LOW (ref 30.0–100.0)

## 2023-01-14 NOTE — Progress Notes (Signed)
Chief Complaint:   OBESITY Christy Middleton (MR# [id number]) is a 57 y.o. female who presents for evaluation and treatment of obesity and related comorbidities. Current BMI is Body mass index is 42.4 kg/m. Christy Middleton has been struggling with her weight for many years and has been unsuccessful in either losing weight, maintaining weight loss, or reaching her healthy weight goal.  Christy Middleton is currently in the action stage of change and ready to dedicate time achieving and maintaining a healthier weight. Christy Middleton is interested in becoming our patient and working on intensive lifestyle modifications including (but not limited to) diet and exercise for weight loss.  Christy Middleton's habits were reviewed today and are as follows: Her family eats meals together, she thinks her family will eat healthier with her, her desired weight loss is 57 lbs, she has been heavy most of her life, she started gaining weight after having two babies, her heaviest weight ever was 250 pounds, she has significant food cravings issues, she is frequently drinking liquids with calories, she frequently makes poor food choices, she frequently eats larger portions than normal, and she struggles with emotional eating.  Depression Screen Brandy's Food and Mood (modified PHQ-9) score was 14.  Subjective:   1. Other fatigue Christy Middleton admits to daytime somnolence and admits to waking up still tired. Patient has a history of symptoms of daytime fatigue and morning fatigue. Christy Middleton generally gets 6 hours of sleep per night, and states that she has nightime awakenings. Snoring is present. Apneic episodes are present. Epworth Sleepiness Score is 11.  Patient has a history of depression and averages approximately 6 hours of sleep per night.  2. SOB (shortness of breath) on exertion Christy Middleton notes increasing shortness of breath with exertion and she denies a history of asthma or smoking, likely related to weight and exercise intolerance.  Her EKG is within normal  limits. She notes getting out of breath sooner with activity than she used to. This has not gotten worse recently. Christy Middleton denies shortness of breath at rest or orthopnea.  3. Prediabetes Christy Middleton's recent A1c was elevated at 5.9.  She is not on metformin, and she notes polyphagia.  4. Vitamin D deficiency Christy Middleton is at high risk of vitamin D deficiency.  She is on a multivitamin with no recent lab results.  Assessment/Plan:   1. Other fatigue Rabiah does feel that her weight is causing her energy to be lower than it should be. Fatigue may be related to obesity, depression or many other causes. Labs will be ordered, and in the meanwhile, Christy Middleton will focus on self care including making healthy food choices, increasing physical activity and focusing on stress reduction.  - EKG 12-Lead  2. SOB (shortness of breath) on exertion Christy Middleton does feel that she gets out of breath more easily that she used to when she exercises. Christy Middleton's shortness of breath appears to be obesity related and exercise induced.  Patient's RMR is significantly lower than expected. She has agreed to work on weight loss and gradually increase exercise to treat her exercise induced shortness of breath. Will continue to monitor closely.  3. Prediabetes We will check labs today.  Christy Middleton will start on her category 2 plan, and we will follow-up at her next visit.  - CMP14+EGFR - Vitamin B12 - Insulin, random  4. Vitamin D deficiency We will check labs today, and we will follow-up at Christy Middleton's next visit.  - VITAMIN D 25 Hydroxy (Vit-D Deficiency, Fractures)  5. Depression screening Christy Middleton had  a positive depression screening. Depression is commonly associated with obesity and often results in emotional eating behaviors. We will monitor this closely and work on CBT to help improve the non-hunger eating patterns. Referral to Psychology may be required if no improvement is seen as she continues in our clinic.  6. Class 3 severe obesity with  serious comorbidity and body mass index (BMI) of 40.0 to 44.9 in adult, unspecified obesity type (HCC) Christy Middleton is currently in the action stage of change and her goal is to continue with weight loss efforts. I recommend Christy Middleton begin the structured treatment plan as follows:  She has agreed to the Category 2 Plan.  Exercise goals: As is.    Behavioral modification strategies: decreasing eating out and meal planning and cooking strategies.  She was informed of the importance of frequent follow-up visits to maximize her success with intensive lifestyle modifications for her multiple health conditions. She was informed we would discuss her lab results at her next visit unless there is a critical issue that needs to be addressed sooner. Christy Middleton agreed to keep her next visit at the agreed upon time to discuss these results.  Objective:   Blood pressure 138/83, pulse 73, temperature 98.7 F (37.1 C), height 5\' 4"  (1.626 m), weight 247 lb (112 kg), SpO2 99 %. Body mass index is 42.4 kg/m.  EKG: Normal sinus rhythm, rate 70 BPM.  Indirect Calorimeter completed today shows a VO2 of 193 and a REE of 1325.  Her calculated basal metabolic rate is 4098 thus her basal metabolic rate is worse than expected.  General: Cooperative, alert, well developed, in no acute distress. HEENT: Conjunctivae and lids unremarkable. Cardiovascular: Regular rhythm.  Lungs: Normal work of breathing. Neurologic: No focal deficits.   Lab Results  Component Value Date   CREATININE 0.86 01/13/2023   BUN 15 01/13/2023   NA 141 01/13/2023   K 4.0 01/13/2023   CL 102 01/13/2023   CO2 21 01/13/2023   Lab Results  Component Value Date   ALT 19 01/13/2023   AST 20 01/13/2023   ALKPHOS 91 01/13/2023   BILITOT <0.2 01/13/2023   No results found for: "HGBA1C" Lab Results  Component Value Date   INSULIN 11.3 01/13/2023   No results found for: "TSH" No results found for: "CHOL", "HDL", "LDLCALC", "LDLDIRECT", "TRIG",  "CHOLHDL" Lab Results  Component Value Date   WBC 4.9 08/17/2017   HGB 12.2 08/17/2017   HCT 36.6 08/17/2017   MCV 77.2 (L) 08/17/2017   PLT 234 08/17/2017   No results found for: "IRON", "TIBC", "FERRITIN"  Attestation Statements:   Reviewed by clinician on day of visit: allergies, medications, problem list, medical history, surgical history, family history, social history, and previous encounter notes.  Time spent on visit including pre-visit chart review and post-visit charting and care was 40 minutes.   I, Burt Knack, am acting as transcriptionist for Quillian Quince, MD.  I have reviewed the above documentation for accuracy and completeness, and I agree with the above. - Quillian Quince, MD

## 2023-01-28 ENCOUNTER — Encounter (INDEPENDENT_AMBULATORY_CARE_PROVIDER_SITE_OTHER): Payer: Self-pay | Admitting: Family Medicine

## 2023-01-28 ENCOUNTER — Ambulatory Visit (INDEPENDENT_AMBULATORY_CARE_PROVIDER_SITE_OTHER): Payer: BC Managed Care – PPO | Admitting: Family Medicine

## 2023-01-28 VITALS — BP 141/85 | HR 79 | Temp 98.3°F | Ht 64.0 in | Wt 245.0 lb

## 2023-01-28 DIAGNOSIS — K5909 Other constipation: Secondary | ICD-10-CM | POA: Diagnosis not present

## 2023-01-28 DIAGNOSIS — E669 Obesity, unspecified: Secondary | ICD-10-CM | POA: Diagnosis not present

## 2023-01-28 DIAGNOSIS — R7303 Prediabetes: Secondary | ICD-10-CM | POA: Diagnosis not present

## 2023-01-28 DIAGNOSIS — Z6841 Body Mass Index (BMI) 40.0 and over, adult: Secondary | ICD-10-CM | POA: Insufficient documentation

## 2023-01-28 DIAGNOSIS — E559 Vitamin D deficiency, unspecified: Secondary | ICD-10-CM | POA: Diagnosis not present

## 2023-01-28 MED ORDER — VITAMIN D (ERGOCALCIFEROL) 1.25 MG (50000 UNIT) PO CAPS
50000.0000 [IU] | ORAL_CAPSULE | ORAL | 0 refills | Status: DC
Start: 2023-01-28 — End: 2023-02-11

## 2023-01-29 NOTE — Progress Notes (Unsigned)
Chief Complaint:   OBESITY Christy Middleton is here to discuss her progress with her obesity treatment plan along with follow-up of her obesity related diagnoses. Christy Middleton is on the Category 2 Plan and states she is following her eating plan approximately 70% of the time. Christy Middleton states she is walking 5,000-6,000 steps.  Today's visit was #: 2 Starting weight: 247 lbs Starting date: 01/13/2023 Today's weight: 245 lbs Today's date: 01/28/2023 Total lbs lost to date: 2 Total lbs lost since last in-office visit: 2  Interim History: Patient started her category 2 plan.  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  Subjective:   1. Vitamin D deficiency Patient is on multivitamins, but her vitamin D level is still low and she notes fatigue.  I discussed labs with the patient today.  2. Prediabetes Patient has a new diagnosis.  Her recent A1c was elevated at 5.9, and fasting glucose and insulin.  She notes polyphagia.  I discussed labs with the patient today.  3. Other constipation Patient has chronic constipation but it has worsened with weight loss.  She is meeting her water goals.  Assessment/Plan:   1. Vitamin D deficiency Patient agreed to start prescription vitamin D at 50,000 IU once weekly with no refills.  - Vitamin D, Ergocalciferol, (DRISDOL) 1.25 MG (50000 UNIT) CAPS capsule; Take 1 capsule (50,000 Units total) by mouth every 7 (seven) days.  Dispense: 5 capsule; Refill: 0  2. Prediabetes Patient was educated on which foods affect blood glucose and which foods increased weight gain, and the importance of protein to decrease diabetes mellitus risk.  3. Other constipation We discussed the importance of fiber and which foods are at highest and fiber but lower in calories.  Patient will continue to to increase her water intake and fiber supplements are okay.  4. BMI 40.0-44.9, adult (HCC)  5. Obesity, Beginning BMI  42.40 Christy Middleton is currently in the action stage of change. As such, her goal is to continue with weight loss efforts. She has agreed to the Category 2 Plan.   Patient was educated on macronutrients and what foods are in which category.   Exercise goals: As is.   Behavioral modification strategies: no skipping meals and meal planning and cooking strategies.  Christy Middleton has agreed to follow-up with our clinic in 2 weeks. She was informed of the importance of frequent follow-up visits to maximize her success with intensive lifestyle modifications for her multiple health conditions.   Objective:   Blood pressure (!) 141/85, pulse 79, temperature 98.3 F (36.8 C), height 5\' 4"  (1.626 m), weight 245 lb (111.1 kg), SpO2 96 %. Body mass index is 42.05 kg/m.  Lab Results  Component Value Date   CREATININE 0.86 01/13/2023   BUN 15 01/13/2023   NA 141 01/13/2023   K 4.0 01/13/2023   CL 102 01/13/2023   CO2 21 01/13/2023   Lab Results  Component Value Date   ALT 19 01/13/2023   AST 20 01/13/2023   ALKPHOS 91 01/13/2023   BILITOT <0.2 01/13/2023   No results found for: "HGBA1C" Lab Results  Component Value Date   INSULIN 11.3 01/13/2023   No results found for: "TSH" No results found for: "CHOL", "HDL", "LDLCALC", "LDLDIRECT", "TRIG", "CHOLHDL" Lab Results  Component Value Date   VD25OH 21.6 (L) 01/13/2023   Lab Results  Component Value Date   WBC 4.9 08/17/2017   HGB 12.2 08/17/2017  HCT 36.6 08/17/2017   MCV 77.2 (L) 08/17/2017   PLT 234 08/17/2017   No results found for: "IRON", "TIBC", "FERRITIN"  Attestation Statements:   Reviewed by clinician on day of visit: allergies, medications, problem list, medical history, surgical history, family history, social history, and previous encounter notes.  Time spent on visit including pre-visit chart review and post-visit care and charting was 43 minutes.   I, Burt Knack, am acting as transcriptionist for Quillian Quince, MD.  I  have reviewed the above documentation for accuracy and completeness, and I agree with the above. -  Quillian Quince, MD

## 2023-02-11 ENCOUNTER — Encounter (INDEPENDENT_AMBULATORY_CARE_PROVIDER_SITE_OTHER): Payer: Self-pay | Admitting: Family Medicine

## 2023-02-11 ENCOUNTER — Ambulatory Visit (INDEPENDENT_AMBULATORY_CARE_PROVIDER_SITE_OTHER): Payer: BC Managed Care – PPO | Admitting: Family Medicine

## 2023-02-11 VITALS — BP 128/80 | HR 70 | Temp 98.0°F | Ht 64.0 in | Wt 244.0 lb

## 2023-02-11 DIAGNOSIS — E669 Obesity, unspecified: Secondary | ICD-10-CM

## 2023-02-11 DIAGNOSIS — I10 Essential (primary) hypertension: Secondary | ICD-10-CM

## 2023-02-11 DIAGNOSIS — E559 Vitamin D deficiency, unspecified: Secondary | ICD-10-CM

## 2023-02-11 DIAGNOSIS — Z6841 Body Mass Index (BMI) 40.0 and over, adult: Secondary | ICD-10-CM | POA: Diagnosis not present

## 2023-02-11 MED ORDER — VITAMIN D (ERGOCALCIFEROL) 1.25 MG (50000 UNIT) PO CAPS
50000.0000 [IU] | ORAL_CAPSULE | ORAL | 0 refills | Status: DC
Start: 2023-02-11 — End: 2023-03-13

## 2023-02-12 NOTE — Progress Notes (Signed)
     Chief Complaint:   OBESITY Christy Middleton is here to discuss her progress with her obesity treatment plan along with follow-up of her obesity related diagnoses. Christy Middleton is on the Category 2 Plan and states she is following her eating plan approximately 80% of the time. Christy Middleton states she is doing 0 minutes 0 times per week.  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  Subjective:   1. Primary hypertension Patient's blood pressure is well-controlled on her medications.  She is working on her weight loss.  2. Vitamin D deficiency Patient is on vitamin D prescription, but her level is not yet at goal.  Assessment/Plan:   1. Primary hypertension Patient will continue with her diet and exercise, and we will continue to monitor her blood pressure.  2. Vitamin D deficiency Patient will continue prescription vitamin D once weekly, we will refill for 1 month.  - Vitamin D, Ergocalciferol, (DRISDOL) 1.25 MG (50000 UNIT) CAPS capsule; Take 1 capsule (50,000 Units total) by mouth every 7 (seven) days.  Dispense: 5 capsule; Refill: 0  3. BMI 40.0-44.9, adult (HCC)  4. Obesity, Beginning BMI 42.40 Christy Middleton is currently in the action stage of change. As such, her goal is to continue with weight loss efforts. She has agreed to the Category 2 Plan.   Exercise goals: Start walking for 10 minutes daily.  Behavioral modification strategies: increasing lean protein intake.  Christy Middleton has agreed to follow-up with our clinic in 2 weeks. She was informed of the importance of frequent follow-up visits to maximize her success with intensive lifestyle modifications for her multiple health conditions.   Objective:    Blood pressure 128/80, pulse 70, temperature 98 F (36.7 C), height 5\' 4"  (1.626 m), weight 244 lb (110.7 kg), SpO2 96 %. Body mass index is 41.88 kg/m.  Lab Results  Component Value Date   CREATININE 0.86 01/13/2023   BUN 15 01/13/2023   NA 141 01/13/2023   K 4.0 01/13/2023   CL 102 01/13/2023   CO2 21 01/13/2023   Lab Results  Component Value Date   ALT 19 01/13/2023   AST 20 01/13/2023   ALKPHOS 91 01/13/2023   BILITOT <0.2 01/13/2023   No results found for: "HGBA1C" Lab Results  Component Value Date   INSULIN 11.3 01/13/2023   No results found for: "TSH" No results found for: "CHOL", "HDL", "LDLCALC", "LDLDIRECT", "TRIG", "CHOLHDL" Lab Results  Component Value Date   VD25OH 21.6 (L) 01/13/2023   Lab Results  Component Value Date   WBC 4.9 08/17/2017   HGB 12.2 08/17/2017   HCT 36.6 08/17/2017   MCV 77.2 (L) 08/17/2017   PLT 234 08/17/2017   No results found for: "IRON", "TIBC", "FERRITIN"  Attestation Statements:   Reviewed by clinician on day of visit: allergies, medications, problem list, medical history, surgical history, family history, social history, and previous encounter notes.   I, Burt Knack, am acting as transcriptionist for Quillian Quince, MD.  I have reviewed the above documentation for accuracy and completeness, and I agree with the above. -  Quillian Quince, MD

## 2023-02-25 ENCOUNTER — Ambulatory Visit (INDEPENDENT_AMBULATORY_CARE_PROVIDER_SITE_OTHER): Payer: BC Managed Care – PPO | Admitting: Family Medicine

## 2023-03-13 ENCOUNTER — Ambulatory Visit (INDEPENDENT_AMBULATORY_CARE_PROVIDER_SITE_OTHER): Payer: BC Managed Care – PPO | Admitting: Family Medicine

## 2023-03-13 ENCOUNTER — Encounter (INDEPENDENT_AMBULATORY_CARE_PROVIDER_SITE_OTHER): Payer: Self-pay | Admitting: Family Medicine

## 2023-03-13 VITALS — BP 120/71 | HR 75 | Temp 97.9°F | Ht 64.0 in | Wt 242.0 lb

## 2023-03-13 DIAGNOSIS — Z6841 Body Mass Index (BMI) 40.0 and over, adult: Secondary | ICD-10-CM

## 2023-03-13 DIAGNOSIS — E559 Vitamin D deficiency, unspecified: Secondary | ICD-10-CM

## 2023-03-13 DIAGNOSIS — I10 Essential (primary) hypertension: Secondary | ICD-10-CM | POA: Diagnosis not present

## 2023-03-13 DIAGNOSIS — E669 Obesity, unspecified: Secondary | ICD-10-CM

## 2023-03-13 MED ORDER — VITAMIN D (ERGOCALCIFEROL) 1.25 MG (50000 UNIT) PO CAPS
50000.0000 [IU] | ORAL_CAPSULE | ORAL | 0 refills | Status: AC
Start: 2023-03-13 — End: ?

## 2023-03-17 NOTE — Progress Notes (Unsigned)
Chief Complaint:   OBESITY Christy Middleton is here to discuss her progress with her obesity treatment plan along with follow-up of her obesity related diagnoses. Christy Middleton is on the Category 2 Plan and states she is following her eating plan approximately 70% of the time. Christy Middleton states she is on the treadmill and walking for 30-40 minutes 3 times per week.  Today's visit was #: 4 Starting weight: 247 lbs Starting date: 01/13/2023 Today's weight: 242 lbs Today's date: 03/13/2023 Total lbs lost to date: 5 Total lbs lost since last in-office visit: 2  Interim History: Patient has done well with her weight loss even with some celebration eating over her birthday.  She is working on increasing her water and her vegetable intake.  Subjective:   1. Vitamin D deficiency Patient is stable on vitamin D, and her last vitamin D level was not yet at goal.  No side effects were noted.  2. Primary hypertension Patient's blood pressure is improving with her diet, no change in medications.  She denies lightheadedness.  Assessment/Plan:   1. Vitamin D deficiency Patient will continue prescription vitamin D, and we will refill for 1 month.  - Vitamin D, Ergocalciferol, (DRISDOL) 1.25 MG (50000 UNIT) CAPS capsule; Take 1 capsule (50,000 Units total) by mouth every 7 (seven) days.  Dispense: 5 capsule; Refill: 0  2. Primary hypertension Patient will continue olmesartan-amlodipine-hydrochlorothiazide, and she will continue with her weight loss and will continue to monitor for signs of hypotension.  3. BMI 40.0-44.9, adult (HCC)  4. Obesity, Beginning BMI 42.40 Christy Middleton is currently in the action stage of change. As such, her goal is to continue with weight loss efforts. She has agreed to the Category 2 Plan.   Exercise goals: As is.   Behavioral modification strategies: increasing lean protein intake and no skipping meals.  Christy Middleton has agreed to follow-up with our clinic in 2 to 3 weeks. She was informed of  the importance of frequent follow-up visits to maximize her success with intensive lifestyle modifications for her multiple health conditions.   Objective:   Blood pressure 120/71, pulse 75, temperature 97.9 F (36.6 C), height 5\' 4"  (1.626 m), weight 242 lb (109.8 kg), SpO2 95%. Body mass index is 41.54 kg/m.  Lab Results  Component Value Date   CREATININE 0.86 01/13/2023   BUN 15 01/13/2023   NA 141 01/13/2023   K 4.0 01/13/2023   CL 102 01/13/2023   CO2 21 01/13/2023   Lab Results  Component Value Date   ALT 19 01/13/2023   AST 20 01/13/2023   ALKPHOS 91 01/13/2023   BILITOT <0.2 01/13/2023   No results found for: "HGBA1C" Lab Results  Component Value Date   INSULIN 11.3 01/13/2023   No results found for: "TSH" No results found for: "CHOL", "HDL", "LDLCALC", "LDLDIRECT", "TRIG", "CHOLHDL" Lab Results  Component Value Date   VD25OH 21.6 (L) 01/13/2023   Lab Results  Component Value Date   WBC 4.9 08/17/2017   HGB 12.2 08/17/2017   HCT 36.6 08/17/2017   MCV 77.2 (L) 08/17/2017   PLT 234 08/17/2017   No results found for: "IRON", "TIBC", "FERRITIN"  Attestation Statements:   Reviewed by clinician on day of visit: allergies, medications, problem list, medical history, surgical history, family history, social history, and previous encounter notes.   I, Burt Knack, am acting as transcriptionist for Quillian Quince, MD.  I have reviewed the above documentation for accuracy and completeness, and I agree with the above. -  Quillian Quince, MD

## 2023-04-08 ENCOUNTER — Ambulatory Visit (INDEPENDENT_AMBULATORY_CARE_PROVIDER_SITE_OTHER): Payer: BC Managed Care – PPO | Admitting: Family Medicine

## 2023-04-30 ENCOUNTER — Ambulatory Visit (INDEPENDENT_AMBULATORY_CARE_PROVIDER_SITE_OTHER): Payer: BC Managed Care – PPO | Admitting: Family Medicine

## 2023-05-07 ENCOUNTER — Ambulatory Visit (INDEPENDENT_AMBULATORY_CARE_PROVIDER_SITE_OTHER): Payer: BC Managed Care – PPO | Admitting: Family Medicine
